# Patient Record
Sex: Female | Born: 1940
Health system: Southern US, Community
[De-identification: ages and names within clinical notes are randomized; demographics above are authoritative.]

## PROBLEM LIST (undated history)

## (undated) DIAGNOSIS — R569 Unspecified convulsions: Secondary | ICD-10-CM

## (undated) DIAGNOSIS — E079 Disorder of thyroid, unspecified: Secondary | ICD-10-CM

## (undated) DIAGNOSIS — I10 Essential (primary) hypertension: Secondary | ICD-10-CM

## (undated) DIAGNOSIS — I639 Cerebral infarction, unspecified: Secondary | ICD-10-CM

## (undated) DIAGNOSIS — M810 Age-related osteoporosis without current pathological fracture: Secondary | ICD-10-CM

## (undated) DIAGNOSIS — E785 Hyperlipidemia, unspecified: Secondary | ICD-10-CM

## (undated) HISTORY — DX: Essential (primary) hypertension: I10

## (undated) HISTORY — DX: Disorder of thyroid, unspecified: E07.9

## (undated) HISTORY — DX: Unspecified convulsions: R56.9

## (undated) HISTORY — PX: EYE SURGERY: SHX253

## (undated) HISTORY — PX: FRACTURE SURGERY: SHX138

## (undated) HISTORY — DX: Cerebral infarction, unspecified: I63.9

## (undated) HISTORY — PX: ELBOW FRACTURE SURGERY: SHX616

## (undated) HISTORY — DX: Age-related osteoporosis without current pathological fracture: M81.0

## (undated) HISTORY — PX: SPINE SURGERY: SHX786

## (undated) HISTORY — DX: Hyperlipidemia, unspecified: E78.5

---

## 2016-09-23 ENCOUNTER — Other Ambulatory Visit: Payer: Self-pay | Admitting: General Surgery

## 2016-09-23 DIAGNOSIS — R921 Mammographic calcification found on diagnostic imaging of breast: Secondary | ICD-10-CM

## 2016-09-30 ENCOUNTER — Ambulatory Visit
Admission: RE | Admit: 2016-09-30 | Discharge: 2016-09-30 | Disposition: A | Discharge status: Home / Self Care | Payer: Medicare Other | Source: Ambulatory Visit | Attending: General Surgery | Admitting: General Surgery

## 2016-09-30 ENCOUNTER — Other Ambulatory Visit: Payer: Self-pay | Admitting: General Surgery

## 2016-09-30 DIAGNOSIS — R921 Mammographic calcification found on diagnostic imaging of breast: Secondary | ICD-10-CM

## 2016-10-03 ENCOUNTER — Ambulatory Visit
Admission: RE | Admit: 2016-10-03 | Discharge: 2016-10-03 | Disposition: A | Discharge status: Home / Self Care | Payer: Medicare Other | Source: Ambulatory Visit | Attending: General Surgery | Admitting: General Surgery

## 2016-10-03 DIAGNOSIS — R921 Mammographic calcification found on diagnostic imaging of breast: Secondary | ICD-10-CM

## 2017-01-29 ENCOUNTER — Telehealth: Payer: Self-pay | Admitting: Emergency Medicine

## 2017-01-29 ENCOUNTER — Ambulatory Visit (INDEPENDENT_AMBULATORY_CARE_PROVIDER_SITE_OTHER): Payer: Medicare Other | Admitting: Emergency Medicine

## 2017-01-29 VITALS — BP 128/78 | HR 77 | Temp 98.1°F | Resp 14 | Wt 147.0 lb

## 2017-01-29 DIAGNOSIS — R0989 Other specified symptoms and signs involving the circulatory and respiratory systems: Secondary | ICD-10-CM | POA: Diagnosis not present

## 2017-01-29 DIAGNOSIS — E079 Disorder of thyroid, unspecified: Secondary | ICD-10-CM | POA: Diagnosis not present

## 2017-01-29 DIAGNOSIS — I10 Essential (primary) hypertension: Secondary | ICD-10-CM | POA: Insufficient documentation

## 2017-01-29 DIAGNOSIS — M810 Age-related osteoporosis without current pathological fracture: Secondary | ICD-10-CM | POA: Diagnosis not present

## 2017-01-29 MED ORDER — LISINOPRIL-HYDROCHLOROTHIAZIDE 20-25 MG PO TABS: 1.0000 | ORAL_TABLET | Freq: Every day | ORAL | 3 refills | Status: DC

## 2017-01-29 MED ORDER — LEVOTHYROXINE SODIUM 75 MCG PO TABS: 75.0000 ug | ORAL_TABLET | Freq: Every day | ORAL | 3 refills | Status: DC

## 2017-01-29 MED ORDER — ALENDRONATE SODIUM 70 MG PO TABS: 70.0000 mg | ORAL_TABLET | ORAL | 6 refills | Status: AC

## 2017-01-29 NOTE — Telephone Encounter (Signed)
Pts husband called & want Dr. Irving ShowsMiguel to see if he can call the ins about meds being sent to home. He said its a service they offered him.   Please Advise

## 2017-01-29 NOTE — Patient Instructions (Signed)
     IF you received an x-ray today, you will receive an invoice from [facility]. Please contact [facility] at [phone] with questions or concerns regarding your invoice.   IF you received labwork today, you will receive an invoice from [facility]. Please contact [facility] at [phone] with questions or concerns regarding your invoice.   Our billing staff will not be able to assist you with questions regarding bills from these companies.  You will be contacted with the lab results as soon as they are available. The fastest way to get your results is to activate your My Chart account. Instructions are located on the last page of this paperwork. If you have not heard from us regarding the results in 2 weeks, please contact this office.     

## 2017-01-29 NOTE — Progress Notes (Signed)
Patient ID: Carrie Floyd, female   DOB: 05/23/1941, 76 y.o.   MRN: [id number] Carrie Floyd 76 y.o.   Chief Complaint  Patient presents with  . Medication Refill    all. Pt unsure which [facility] to use    HISTORY OF PRESENT ILLNESS: This is a 76 y.o. female has no complaints. Wants to establish care; has h/o HTN, thyroid condition, and Osteoporosis.  HPI   Prior to Admission medications   Medication Sig Start Date End Date Taking? Authorizing Provider  alendronate (FOSAMAX) 70 MG tablet Take 70 mg by mouth once a week. Take with a full glass of water on an empty stomach.   Yes Historical Provider, MD  aspirin EC 81 MG tablet Take 81 mg by mouth daily.   Yes Historical Provider, MD  levothyroxine (SYNTHROID, LEVOTHROID) 75 MCG tablet Take 75 mcg by mouth daily before breakfast.   Yes Historical Provider, MD  lisinopril-hydrochlorothiazide (PRINZIDE,ZESTORETIC) 20-25 MG tablet Take 1 tablet by mouth daily.   Yes Historical Provider, MD  POTASSIUM PO Take 99 mg by mouth.   Yes Historical Provider, MD    Not on File  There are no active problems to display for this patient.   Past Medical History:  Diagnosis Date  . Hypertension   . Thyroid disease     Past Surgical History:  Procedure Laterality Date  . EYE SURGERY    . FRACTURE SURGERY    . SPINE SURGERY      Social History   Social History  . Marital status: Married    Spouse name: N/A  . Number of children: N/A  . Years of education: N/A   Occupational History  . Not on file.   Social History Main Topics  . Smoking status: Never Smoker  . Smokeless tobacco: Never Used  . Alcohol use Not on file  . Drug use: Unknown  . Sexual activity: Not on file   Other Topics Concern  . Not on file   Social History Narrative  . No narrative on file    Family History  Problem Relation Age of Onset  . Hyperlipidemia Sister   . Hypertension Sister      Review of Systems  Constitutional: Negative for chills,  fever, malaise/fatigue and weight loss.  HENT: Negative for congestion, hearing loss, nosebleeds, sinus pain, sore throat and tinnitus.   Eyes: Negative for blurred vision, double vision and pain.  Respiratory: Negative for cough, hemoptysis, shortness of breath and wheezing.   Cardiovascular: Negative for chest pain, palpitations, orthopnea, claudication, leg swelling and PND.  Gastrointestinal: Negative for abdominal pain, blood in stool, diarrhea, melena, nausea and vomiting.  Genitourinary: Negative for dysuria, flank pain and hematuria.  Musculoskeletal: Negative for back pain, joint pain, myalgias and neck pain.       Chronic leg cramps.  Skin: Negative for rash.  Neurological: Negative for dizziness, sensory change, speech change, focal weakness and headaches.       Has h/o CVA no deficits  Endo/Heme/Allergies: Negative.   All other systems reviewed and are negative.  Vitals:   01/29/17 0852  BP: 128/78  Pulse: 77  Resp: 14  Temp: 98.1 F (36.7 C)     Physical Exam  Constitutional: She is oriented to person, place, and time. She appears well-developed and well-nourished.  HENT:  Head: Normocephalic.  Right Ear: External ear normal.  Left Ear: External ear normal.  Nose: Nose normal.  Mouth/Throat: Oropharynx is clear and moist. No oropharyngeal exudate.  Eyes: Conjunctivae  and EOM are normal. Pupils are equal, round, and reactive to light.  Neck: Normal range of motion. Neck supple. No JVD present. Carotid bruit is present (L>R). No thyromegaly present.  Cardiovascular: Normal rate, regular rhythm, normal heart sounds and intact distal pulses.   No murmur heard. No leg edema.  Pulmonary/Chest: Effort normal and breath sounds normal. She has no wheezes. She has no rales.  Abdominal: Soft. Bowel sounds are normal. She exhibits no distension. There is no tenderness.  Musculoskeletal: Normal range of motion.  Lymphadenopathy:    She has no cervical adenopathy.   Neurological: She is alert and oriented to person, place, and time. No sensory deficit. She exhibits normal muscle tone. Coordination normal.  Skin: Skin is warm and dry. Capillary refill takes less than 2 seconds. No rash noted.  Psychiatric: She has a normal mood and affect. Her behavior is normal.  Vitals reviewed.    ASSESSMENT & PLAN: Carrie Floyd was seen today for medication refill.  Diagnoses and all orders for this visit:  Essential hypertension -     Comprehensive metabolic panel -     CBC with Differential/Platelet -     Lipid panel  Osteoporosis, unspecified osteoporosis type, unspecified pathological fracture presence  Thyroid condition -     TSH  Bilateral carotid bruits -     US Carotid Duplex Bilateral; Future      Edwina Barth, MD [facility]

## 2017-01-29 NOTE — Progress Notes (Signed)
Left message on home vm to [facility] with requested pharmacy.  [employer] interpretor used [id number]

## 2017-01-30 LAB — CBC WITH DIFFERENTIAL/PLATELET
Basophils Absolute: 0 10*3/uL (ref 0.0–0.2)
Basos: 0 %
EOS (ABSOLUTE): 0.2 10*3/uL (ref 0.0–0.4)
EOS: 2 %
HEMATOCRIT: 39.3 % (ref 34.0–46.6)
Hemoglobin: 12.6 g/dL (ref 11.1–15.9)
IMMATURE GRANULOCYTES: 1 %
Immature Grans (Abs): 0.1 10*3/uL (ref 0.0–0.1)
Lymphocytes Absolute: 2.4 10*3/uL (ref 0.7–3.1)
Lymphs: 27 %
MCH: 28.8 pg (ref 26.6–33.0)
MCHC: 32.1 g/dL (ref 31.5–35.7)
MCV: 90 fL (ref 79–97)
MONOS ABS: 0.6 10*3/uL (ref 0.1–0.9)
Monocytes: 7 %
NEUTROS PCT: 63 %
Neutrophils Absolute: 5.4 10*3/uL (ref 1.4–7.0)
PLATELETS: 238 10*3/uL (ref 150–379)
RBC: 4.38 x10E6/uL (ref 3.77–5.28)
RDW: 13.8 % (ref 12.3–15.4)
WBC: 8.7 10*3/uL (ref 3.4–10.8)

## 2017-01-30 LAB — COMPREHENSIVE METABOLIC PANEL
ALK PHOS: 55 IU/L (ref 39–117)
ALT: 13 IU/L (ref 0–32)
AST: 21 IU/L (ref 0–40)
Albumin/Globulin Ratio: 1.4 (ref 1.2–2.2)
Albumin: 4.2 g/dL (ref 3.5–4.8)
BUN/Creatinine Ratio: 22 (ref 12–28)
BUN: 15 mg/dL (ref 8–27)
Bilirubin Total: <0.2 mg/dL (ref 0.0–1.2)
CO2: 28 mmol/L (ref 18–29)
Calcium: 9.6 mg/dL (ref 8.7–10.3)
Chloride: 98 mmol/L (ref 96–106)
Creatinine, Ser: 0.67 mg/dL (ref 0.57–1.00)
GFR calc Af Amer: 100 mL/min/{1.73_m2} (ref >59)
GFR calc non Af Amer: 87 mL/min/{1.73_m2} (ref >59)
GLOBULIN, TOTAL: 3.1 g/dL (ref 1.5–4.5)
Glucose: 91 mg/dL (ref 65–99)
Potassium: 3.9 mmol/L (ref 3.5–5.2)
Sodium: 142 mmol/L (ref 134–144)
Total Protein: 7.3 g/dL (ref 6.0–8.5)

## 2017-01-30 LAB — LIPID PANEL
CHOLESTEROL TOTAL: 247 mg/dL — AB (ref 100–199)
Chol/HDL Ratio: 5 ratio units — ABNORMAL HIGH (ref 0.0–4.4)
HDL: 49 mg/dL (ref >39)
LDL Calculated: 171 mg/dL — ABNORMAL HIGH (ref 0–99)
TRIGLYCERIDES: 133 mg/dL (ref 0–149)
VLDL Cholesterol Cal: 27 mg/dL (ref 5–40)

## 2017-01-30 LAB — TSH: TSH: 1.4 u[IU]/mL (ref 0.450–4.500)

## 2017-01-31 NOTE — Telephone Encounter (Signed)
Spoke with patient's husband. He doesn't have time to discuss this right now, and stated he would call back later. He was not able to give me the name of the mail order pharmacy they'd like to use.

## 2017-03-05 ENCOUNTER — Ambulatory Visit (INDEPENDENT_AMBULATORY_CARE_PROVIDER_SITE_OTHER): Payer: Medicare Other | Admitting: Emergency Medicine

## 2017-03-05 ENCOUNTER — Ambulatory Visit (INDEPENDENT_AMBULATORY_CARE_PROVIDER_SITE_OTHER): Payer: Medicare Other

## 2017-03-05 ENCOUNTER — Encounter: Payer: Self-pay | Admitting: Emergency Medicine

## 2017-03-05 VITALS — BP 132/60 | HR 93 | Temp 98.5°F | Resp 16 | Ht 61.5 in | Wt 143.0 lb

## 2017-03-05 DIAGNOSIS — R1013 Epigastric pain: Secondary | ICD-10-CM | POA: Insufficient documentation

## 2017-03-05 DIAGNOSIS — I10 Essential (primary) hypertension: Secondary | ICD-10-CM | POA: Diagnosis not present

## 2017-03-05 DIAGNOSIS — R0989 Other specified symptoms and signs involving the circulatory and respiratory systems: Secondary | ICD-10-CM | POA: Diagnosis not present

## 2017-03-05 DIAGNOSIS — S29011A Strain of muscle and tendon of front wall of thorax, initial encounter: Secondary | ICD-10-CM

## 2017-03-05 MED ORDER — TRAMADOL HCL 50 MG PO TABS: 50.0000 mg | ORAL_TABLET | Freq: Three times a day (TID) | ORAL | 0 refills | Status: DC | PRN

## 2017-03-05 MED ORDER — KETOROLAC TROMETHAMINE 60 MG/2ML IM SOLN
60.0000 mg | Freq: Once | INTRAMUSCULAR | Status: AC
  Administered 2017-03-05: 60 mg via INTRAMUSCULAR

## 2017-03-05 NOTE — Progress Notes (Signed)
Carrie Floyd 76 y.o.   Chief Complaint  Patient presents with  . Flank Pain    NEAR RIGHT BREAST x 2 days    HISTORY OF PRESENT ILLNESS: This is a 76 y.o. female complaining of pain to right lateral ribcage area x 2 days; thinks she strained the area prior to symptoms.  HPI   Prior to Admission medications   Medication Sig Start Date End Date Taking? Authorizing Provider  aspirin EC 81 MG tablet Take 81 mg by mouth daily.   Yes Historical Provider, MD  levothyroxine (SYNTHROID, LEVOTHROID) 75 MCG tablet Take 1 tablet (75 mcg total) by mouth daily before breakfast. 01/29/17 04/29/17 Yes Devontre Siedschlag Victorino December, MD  lisinopril-hydrochlorothiazide (PRINZIDE,ZESTORETIC) 20-25 MG tablet Take 1 tablet by mouth daily. 01/29/17 04/29/17 Yes Tyjai Charbonnet Victorino December, MD  POTASSIUM PO Take 99 mg by mouth.   Yes Historical Provider, MD    No Known Allergies  Patient Active Problem List   Diagnosis Date Noted  . Essential hypertension 01/29/2017  . Thyroid condition 01/29/2017  . Bilateral carotid bruits 01/29/2017  . Osteoporosis 01/29/2017    Past Medical History:  Diagnosis Date  . Hypertension   . Osteoporosis   . Stroke (HCC)   . Thyroid disease     Past Surgical History:  Procedure Laterality Date  . EYE SURGERY    . FRACTURE SURGERY    . SPINE SURGERY      Social History   Social History  . Marital status: Married    Spouse name: N/A  . Number of children: N/A  . Years of education: N/A   Occupational History  . Not on file.   Social History Main Topics  . Smoking status: Never Smoker  . Smokeless tobacco: Never Used  . Alcohol use Not on file  . Drug use: Unknown  . Sexual activity: Not on file   Other Topics Concern  . Not on file   Social History Narrative  . No narrative on file    Family History  Problem Relation Age of Onset  . Hyperlipidemia Sister   . Hypertension Sister      Review of Systems  Constitutional: Negative for chills, fever and  weight loss.       Appetite change. Distaste for fried/greasy foods.  HENT: Negative.   Eyes: Negative.   Respiratory: Negative for cough and shortness of breath.   Cardiovascular: Negative.  Negative for chest pain, palpitations and leg swelling.  Gastrointestinal: Positive for abdominal pain (epigastric). Negative for blood in stool, diarrhea, melena, nausea and vomiting.  Genitourinary: Negative for dysuria and hematuria.  Musculoskeletal: Negative for back pain, joint pain, myalgias and neck pain.       Chronic leg cramps  Skin: Negative.  Negative for rash.  Neurological: Negative for dizziness, sensory change, focal weakness and headaches.  Endo/Heme/Allergies: Negative.   All other systems reviewed and are negative.  Vitals:   03/05/17 0956 03/05/17 1019  BP: (!) 142/72 132/60  Pulse: 93   Resp: 16   Temp: 98.5 F (36.9 C)      Physical Exam  Constitutional: She is oriented to person, place, and time. She appears well-developed and well-nourished.  HENT:  Head: Normocephalic and atraumatic.  Mouth/Throat: Oropharynx is clear and moist. No oropharyngeal exudate.  Eyes: Conjunctivae and EOM are normal. Pupils are equal, round, and reactive to light.  Neck: Normal range of motion. Neck supple. No JVD present. No thyromegaly present.  Cardiovascular: Normal rate, regular rhythm, normal heart  sounds and intact distal pulses.   Pulmonary/Chest: Effort normal and breath sounds normal. She has no wheezes. She has no rales. She exhibits tenderness (right mid-lateral, pain reproduced with arm movement). She exhibits no crepitus. Right breast exhibits no mass, no nipple discharge, no skin change and no tenderness.  Abdominal: Soft. Bowel sounds are normal. She exhibits no distension. There is no tenderness.  Musculoskeletal: Normal range of motion.  Lymphadenopathy:    She has no cervical adenopathy.  Neurological: She is alert and oriented to person, place, and time.  Skin: Skin  is warm and dry. Capillary refill takes less than 2 seconds. No rash noted.  Psychiatric: She has a normal mood and affect. Her behavior is normal.  Vitals reviewed.    ASSESSMENT & PLAN: Carrie Floyd was seen today for flank pain.  Diagnoses and all orders for this visit:  Muscle strain of chest wall, initial encounter -     ketorolac (TORADOL) injection 60 mg; Inject 2 mLs (60 mg total) into the muscle once. -     DG Chest 2 View; Future  Essential hypertension  Epigastric discomfort -     Ambulatory referral to Gastroenterology  Other orders -     traMADol (ULTRAM) 50 MG tablet; Take 1 tablet (50 mg total) by mouth every 8 (eight) hours as needed.    Patient Instructions       IF you received an x-ray today, you will receive an invoice from [facility]. Please contact [facility] at [phone] with questions or concerns regarding your invoice.   IF you received labwork today, you will receive an invoice from [facility]. Please contact [facility] at [phone] with questions or concerns regarding your invoice.   Our billing staff will not be able to assist you with questions regarding bills from these companies.  You will be contacted with the lab results as soon as they are available. The fastest way to get your results is to activate your My Chart account. Instructions are located on the last page of this paperwork. If you have not heard from [place] regarding the results in 2 weeks, please contact this office.    We recommend that you schedule a mammogram for breast cancer screening. Typically, you do not need a referral to do this. Please contact a local imaging center to schedule your mammogram.  [facility] - [phone]  *ask for the Radiology Department [facility]) - [phone] or [phone]  [facility] - [phone] [facility] - [phone] [facility] - [phone]   *ask for the Radiology Department [facility] - [phone]  *ask for the Radiology Department [facility] - [phone]  *ask for the Mammography Department [facility] - [phone] Dolor en la pared torcica (Chest Wall Pain) El dolor en la pared torcica se produce en los huesos y los msculos del pecho o alrededor de Scientist, product/process development. A veces, una lesin Occupational psychologist. En ocasiones, la causa puede ser desconocida. Este dolor puede durar varias semanas. CUIDADOS EN EL HOGAR Est atento a cualquier cambio en los sntomas. Tome estas medidas para Acupuncturist dolor:  Sports administrator reposo tal como le indic el mdico.  Evite las actividades que causan dolor. Intente no usar los [address] del trax, los del vientre (abdominales) o los laterales para levantar objetos pesados.  Si se lo indican, aplique hielo sobre la zona dolorida:  Ponga el hielo en una bolsa plstica.  Coloque una toalla entre la piel y la bolsa de hielo.  Coloque el hielo durante , 2 a 3veces por Futures trader.  Tome los medicamentos de venta libre y los recetados solamente como se lo haya indicado el mdico.  No consuma productos que contengan tabaco, incluidos cigarrillos, tabaco de Theatre manager y Administrator, Civil Service. Si necesita ayuda para dejar de fumar, consulte al mdico.  Concurra a todas las visitas de control como se lo haya indicado el mdico. Esto es importante. SOLICITE AYUDA SI:  Tiene fiebre.  El dolor en el pecho [place].  Aparecen nuevos sntomas. SOLICITE AYUDA DE INMEDIATO SI:  Tiene malestar estomacal (nuseas) o vomita.  Berenice Primas o tiene sensacin de desvanecimiento.  Tiene tos con flema (esputo) o expectora sangre al toser.  Comienza a sentir falta de aire. Esta informacin no tiene Theme park manager el consejo del mdico. Asegrese de hacerle al mdico cualquier pregunta que tenga. Document Released: 10/10/2011 Document Revised: 07/12/2015 Document  Reviewed: 01/16/2015 Elsevier Interactive Patient Education  2017 Elsevier Inc.      Edwina Barth, MD [facility]

## 2017-03-05 NOTE — Addendum Note (Signed)
Addended by: Evie Lacks on: 03/05/2017 12:26 PM   Modules accepted: Orders

## 2017-03-05 NOTE — Patient Instructions (Addendum)
     IF you received an x-ray today, you will receive an invoice from [facility]. Please contact [facility] at [phone] with questions or concerns regarding your invoice.   IF you received labwork today, you will receive an invoice from [facility]. Please contact [facility] at [phone] with questions or concerns regarding your invoice.   Our billing staff will not be able to assist you with questions regarding bills from these companies.  You will be contacted with the lab results as soon as they are available. The fastest way to get your results is to activate your My Chart account. Instructions are located on the last page of this paperwork. If you have not heard from [place] regarding the results in 2 weeks, please contact this office.    We recommend that you schedule a mammogram for breast cancer screening. Typically, you do not need a referral to do this. Please contact a local imaging center to schedule your mammogram.  [facility] - [phone]  *ask for the Radiology Department [facility]) - [phone] or [phone]  [facility] - [phone] [facility] - [phone] [facility] - [phone]  *ask for the Radiology Department [facility] - [phone]  *ask for the Radiology Department [facility] - [phone]  *ask for the Mammography Department [facility] - [phone] Dolor en la pared torcica (Chest Wall Pain) El dolor en la pared torcica se produce en los huesos y los msculos del pecho o alrededor de Scientist, product/process development. A veces, una lesin Occupational psychologist. En ocasiones, la causa puede ser desconocida. Este dolor puede durar varias semanas. CUIDADOS EN EL HOGAR Est atento a cualquier cambio en los sntomas. Tome estas medidas para Acupuncturist dolor:  Sports administrator reposo tal como le indic el mdico.  Evite las actividades que causan dolor.  Intente no usar los [address] del trax, los del vientre (abdominales) o los laterales para levantar objetos pesados.  Si se lo indican, aplique hielo sobre la zona dolorida:  Ponga el hielo en una bolsa plstica.  Coloque una toalla entre la piel y la bolsa de hielo.  Coloque el hielo durante , 2 a 3veces por Futures trader.  Tome los medicamentos de venta libre y los recetados solamente como se lo haya indicado el mdico.  No consuma productos que contengan tabaco, incluidos cigarrillos, tabaco de Theatre manager y Administrator, Civil Service. Si necesita ayuda para dejar de fumar, consulte al mdico.  Concurra a todas las visitas de control como se lo haya indicado el mdico. Esto es importante. SOLICITE AYUDA SI:  Tiene fiebre.  El dolor en el pecho [place].  Aparecen nuevos sntomas. SOLICITE AYUDA DE INMEDIATO SI:  Tiene malestar estomacal (nuseas) o vomita.  Berenice Primas o tiene sensacin de desvanecimiento.  Tiene tos con flema (esputo) o expectora sangre al toser.  Comienza a sentir falta de aire. Esta informacin no tiene Theme park manager el consejo del mdico. Asegrese de hacerle al mdico cualquier pregunta que tenga. Document Released: 10/10/2011 Document Revised: 07/12/2015 Document Reviewed: 01/16/2015 Elsevier Interactive Patient Education  2017 [employer].

## 2017-03-06 ENCOUNTER — Telehealth: Payer: Self-pay

## 2017-03-06 DIAGNOSIS — R0989 Other specified symptoms and signs involving the circulatory and respiratory systems: Secondary | ICD-10-CM

## 2017-03-06 NOTE — Telephone Encounter (Signed)
Imaging order needs to be changed to [id number] [id number] per Radiology  Please sign as appropriate

## 2017-03-06 NOTE — Telephone Encounter (Signed)
Done

## 2017-03-10 ENCOUNTER — Encounter: Payer: Self-pay | Admitting: Internal Medicine

## 2017-03-12 ENCOUNTER — Ambulatory Visit (HOSPITAL_COMMUNITY): Payer: Medicare Other

## 2017-04-11 ENCOUNTER — Encounter: Payer: Self-pay | Admitting: Internal Medicine

## 2017-04-11 ENCOUNTER — Encounter (INDEPENDENT_AMBULATORY_CARE_PROVIDER_SITE_OTHER): Payer: Self-pay

## 2017-04-11 ENCOUNTER — Ambulatory Visit (INDEPENDENT_AMBULATORY_CARE_PROVIDER_SITE_OTHER): Payer: Medicare Other | Admitting: Internal Medicine

## 2017-04-11 VITALS — BP 104/60 | HR 70 | Wt 143.0 lb

## 2017-04-11 DIAGNOSIS — K253 Acute gastric ulcer without hemorrhage or perforation: Secondary | ICD-10-CM | POA: Diagnosis not present

## 2017-04-11 DIAGNOSIS — R1013 Epigastric pain: Secondary | ICD-10-CM

## 2017-04-11 DIAGNOSIS — R634 Abnormal weight loss: Secondary | ICD-10-CM

## 2017-04-11 MED ORDER — OMEPRAZOLE 40 MG PO CPDR: 40.0000 mg | DELAYED_RELEASE_CAPSULE | Freq: Every day | ORAL | 6 refills | Status: DC

## 2017-04-11 MED FILL — OMEPRAZOLE DR 40 MG CAPSULE: 40 | 60 days supply | Qty: 60 | Fill #0

## 2017-04-11 NOTE — Patient Instructions (Signed)
We have sent the following medications to your pharmacy for you to pick up at your convenience:  Omeprazole  You have been scheduled for an endoscopy and flexible sigmoidoscopy. Please follow the written instructions given to you at your visit today. If you use inhalers (even only as needed), please bring them with you on the day of your procedure. Your physician has requested that you go to www.startemmi.com and enter the access code given to you at your visit today. This web site gives a general overview about your procedure. However, you should still follow specific instructions given to you by our office regarding your preparation for the procedure.

## 2017-04-11 NOTE — Progress Notes (Signed)
HISTORY OF PRESENT ILLNESS:  Carrie Floyd is a 76 y.o. female , native of [place], who is referred today by the internal medicine clinic Dr. Terence LuxSargardia with a chief complaint of epigastric pain. She is accompanied by her husband and a professional interpreter. Outside records from [place] and local outside records personally reviewed. The patient reports a one-year history of epigastric and lower chest discomfort. She underwent workup while living in [place]. She does bring with her the colonoscopy and upper endoscopy reports. Colonoscopy was negative except for diminutive polyps (no pathology available). 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 According to the patient she was treated normal biopsies performed. Her PCP told her to take Pepcid when her stomach bothers her. She does report that the discomfort is exacerbated by meals. She is lost 10-15 pounds over the past year. Nausea with rare vomiting. No hematemesis. No melena. She does take aspirin no additional NSAIDs. Recently took tramadol for right side pain which has since resolved. Review of blood work from March 2018 shows unremarkable comprehensive metabolic panel and CBC with hemoglobin 12.6. GI review of systems is otherwise negative. No history of abdominal surgeries.  REVIEW OF SYSTEMS:  All non-GI ROS negative upon comprehensive review  Past Medical History:  Diagnosis Date  . Hypertension   . Osteoporosis   . Stroke (HCC)   . Thyroid disease     Past Surgical History:  Procedure Laterality Date  . EYE SURGERY    . FRACTURE SURGERY    . SPINE SURGERY      Social History Carrie Mattedelaida [place]  reports that she has never smoked. She has never used smokeless tobacco. She reports that she does not drink alcohol or use drugs.  family history includes Hyperlipidemia in her sister; Hypertension in her sister.  No Known Allergies     PHYSICAL EXAMINATION: Vital signs: BP 104/60   Pulse 70   Wt 143 lb (64.9 kg)    BMI 26.58 kg/m   Constitutional: generally well-appearing, no acute distress Psychiatric: alert and oriented x3, cooperative Eyes: extraocular movements intact, anicteric, conjunctiva pink Mouth: oral pharynx moist, no lesions Neck: supple no lymphadenopathy Cardiovascular: heart regular rate and rhythm, no murmur Lungs: clear to auscultation bilaterally Abdomen: soft, Mild epigastric tenderness with palpation, no mass, nondistended, no obvious ascites, no peritoneal signs, normal bowel sounds, no organomegaly Rectal:Omitted Extremities: no clubbing cyanosis or lower extremity edema bilaterally Skin: no lesions on visible extremities Neuro: No focal deficits. Cranial nerves intact  ASSESSMENT:  #1. Epigastric pain. Chronic. Suspect secondary to gastric ulcer #2. Weight loss. Suspect secondary to #1 above   PLAN:  #1. Prescribe omeprazole 40 mg twice daily #2. Avoid NSAIDs. Okay to take daily baby aspirin for cardioprotective or cerebrovascular protective purposes. She takes this infrequently am told #3. Schedule upper endoscopy with biopsies.The nature of the procedure, as well as the risks, benefits, and alternatives were carefully and thoroughly reviewed with the patient. Ample time for discussion and questions allowed. The patient understood, was satisfied, and agreed to proceed. #4. If pain persists despite PPI and no evidence of ulcer then recommend imaging studies including ultrasound to rule out gallstones and/or CT scan to rule out pancreatic cancer. I discussed this with them  A copy of this consultation has been sent to Dr. Terence LuxSargardia

## 2017-05-09 ENCOUNTER — Encounter: Payer: Self-pay | Admitting: Internal Medicine

## 2017-05-21 ENCOUNTER — Encounter: Payer: Self-pay | Admitting: Internal Medicine

## 2017-05-21 ENCOUNTER — Ambulatory Visit (AMBULATORY_SURGERY_CENTER): Payer: Medicare Other | Admitting: Internal Medicine

## 2017-05-21 VITALS — BP 105/54 | HR 65 | Temp 98.4°F | Resp 21 | Ht 61.5 in | Wt 143.0 lb

## 2017-05-21 DIAGNOSIS — R1013 Epigastric pain: Secondary | ICD-10-CM | POA: Diagnosis present

## 2017-05-21 DIAGNOSIS — K253 Acute gastric ulcer without hemorrhage or perforation: Secondary | ICD-10-CM

## 2017-05-21 MED ORDER — SODIUM CHLORIDE 0.9 % IV SOLN: 500.0000 mL | INTRAVENOUS | Status: DC

## 2017-05-21 NOTE — Progress Notes (Signed)
No problems noted in the recovery room. maw 

## 2017-05-21 NOTE — Progress Notes (Signed)
Called to room to assist during endoscopic procedure.  Patient ID and intended procedure confirmed with present staff. Received instructions for my participation in the procedure from the performing physician.  

## 2017-05-21 NOTE — Op Note (Signed)
[facility] Patient Name: Carrie Floyd Procedure Date: 05/21/2017 10:12 AM MRN: [id number] Endoscopist: Wilhemina Bonito. Marina Goodell , MD Age: 76 Referring MD:  Date of Birth: 1941-01-22 Gender: Female Account #: [id number] Procedure:                Upper GI endoscopy with biopsies Indications:              Epigastric abdominal pain. Personal history of                            gastric ulcer elsewhere. Placed on PPI with                            improvement in symptoms. Now taking PPI sporadically Medicines:                Monitored Anesthesia Care Procedure:                Pre-Anesthesia Assessment:                           - Prior to the procedure, a History and Physical                            was performed, and patient medications and                            allergies were reviewed. The patient's tolerance of                            previous anesthesia was also reviewed. The risks                            and benefits of the procedure and the sedation                            options and risks were discussed with the patient.                            All questions were answered, and informed consent                            was obtained. Prior Anticoagulants: The patient has                            taken no previous anticoagulant or antiplatelet                            agents. ASA Grade Assessment: II - A patient with                            mild systemic disease. After reviewing the risks                            and benefits, the patient was deemed in  satisfactory condition to undergo the procedure.                           After obtaining informed consent, the endoscope was                            passed under direct vision. Throughout the                            procedure, the patient's blood pressure, pulse, and                            oxygen saturations were monitored continuously. The    Endoscope was introduced through the mouth, and                            advanced to the second part of duodenum. The upper                            GI endoscopy was accomplished without difficulty.                            The patient tolerated the procedure well. Scope In: Scope Out: Findings:                 The esophagus was normal.                           One superficial gastric ulcer was found at the                            incisura. Biopsies were taken with a cold forceps                            for Helicobacter pylori testing using CLOtest.                           The stomach revealed diffusely atrophic gastric                            mucosa but was normal.                           The examined duodenum was normal.                           The cardia and gastric fundus were normal on                            retroflexion. Complications:            No immediate complications. Estimated Blood Loss:     Estimated blood loss: none. Impression:               - Normal esophagus.                           -  Gastric ulcer. Biopsied.                           - Normal stomach.                           - Normal examined duodenum. Recommendation:           1. Resume omeprazole 20 mg once daily                           2. Follow-up testing for Helicobacter pylori and                            treat if positive                           3. Avoid unnecessary NSAIDs. Baby aspirin okay if                            important for cardiovascular or cerebrovascular                            protective purposes                           4. Office follow-up with Dr. Marina GoodellPerry in about 8 weeks. Wilhemina BonitoJohn N. Marina GoodellPerry, MD 05/21/2017 10:29:25 AM This report has been signed electronically.

## 2017-05-21 NOTE — Progress Notes (Signed)
Spontaneous respirations throughout. VSS. Resting comfortably. To PACU on room air. Report to  Annette RN.  

## 2017-05-21 NOTE — Progress Notes (Signed)
Pt speaks spanish and has an interpreter, [employer] Mor[employer].  Maw

## 2017-05-21 NOTE — Patient Instructions (Signed)
YOU HAD AN ENDOSCOPIC PROCEDURE TODAY AT THE [facility]:   Refer to the procedure report that was given to you for any specific questions about what was found during the examination.  If the procedure report does not answer your questions, please call your gastroenterologist to clarify.  If you requested that your care partner not be given the details of your procedure findings, then the procedure report has been included in a sealed envelope for you to review at your convenience later.  YOU SHOULD EXPECT: Some feelings of bloating in the abdomen. Passage of more gas than usual.  Walking can help get rid of the air that was put into your GI tract during the procedure and reduce the bloating. If you had a lower endoscopy (such as a colonoscopy or flexible sigmoidoscopy) you may notice spotting of blood in your stool or on the toilet paper. If you underwent a bowel prep for your procedure, you may not have a normal bowel movement for a few days.  Please Note:  You might notice some irritation and congestion in your nose or some drainage.  This is from the oxygen used during your procedure.  There is no need for concern and it should clear up in a day or so.  SYMPTOMS TO REPORT IMMEDIATELY:    Following upper endoscopy (EGD)  Vomiting of blood or coffee ground material  New chest pain or pain under the shoulder blades  Painful or persistently difficult swallowing  New shortness of breath  Fever of 100F or higher  Black, tarry-looking stools  For urgent or emergent issues, a gastroenterologist can be reached at any hour by calling [phone].   DIET:  We do recommend a small meal at first, but then you may proceed to your regular diet.  Drink plenty of fluids but you should avoid alcoholic beverages for 24 hours.  ACTIVITY:  You should plan to take it easy for the rest of today and you should NOT DRIVE or use heavy machinery until tomorrow (because of the sedation medicines used  during the test).    FOLLOW UP: Our staff will call the number listed on your records the next business day following your procedure to check on you and address any questions or concerns that you may have regarding the information given to you following your procedure. If we do not reach you, we will leave a message.  However, if you are feeling well and you are not experiencing any problems, there is no need to return our call.  We will assume that you have returned to your regular daily activities without incident.  If any biopsies were taken you will be contacted by phone or by letter within the next 1-3 weeks.  Please call us at [phone] if you have not heard about the biopsies in 3 weeks.    SIGNATURES/CONFIDENTIALITY: You and/or your care partner have signed paperwork which will be entered into your electronic medical record.  These signatures attest to the fact that that the information above on your After Visit Summary has been reviewed and is understood.  Full responsibility of the confidentiality of this discharge information lies with you and/or your care-partner.    Resume OMEPRAZOLE 20 mg to once daily. Avoid NSAIDs.  Baby aspirin okay if important for cardiovascular or cerebrovascular protective purposes. You may resume your other current medications today. Await biopsy results. Please call if any questions or concerns.

## 2017-05-22 ENCOUNTER — Telehealth: Payer: Self-pay

## 2017-05-22 LAB — HELICOBACTER PYLORI SCREEN-BIOPSY: UREASE: NEGATIVE

## 2017-05-22 NOTE — Telephone Encounter (Signed)
  Follow up Call-  No flowsheet data found.   Patient questions:  Do you have a fever, pain , or abdominal swelling? No. Pain Score  0 *  Have you tolerated food without any problems? Yes.    Have you been able to return to your normal activities? Yes.    Do you have any questions about your discharge instructions: Diet   No. Medications  No. Follow up visit  No.  Do you have questions or concerns about your Care? No.  Actions: * If pain score is 4 or above: No action needed, pain <4.  I spoke with pt's husband, since pt speaks spanish.  Husband speaks some english.  No problems noted per pt's husband. maw

## 2017-05-30 ENCOUNTER — Ambulatory Visit (INDEPENDENT_AMBULATORY_CARE_PROVIDER_SITE_OTHER): Payer: Medicare Other

## 2017-05-30 ENCOUNTER — Encounter: Payer: Self-pay | Admitting: Urgent Care

## 2017-05-30 ENCOUNTER — Ambulatory Visit (INDEPENDENT_AMBULATORY_CARE_PROVIDER_SITE_OTHER): Payer: Medicare Other | Admitting: Urgent Care

## 2017-05-30 VITALS — BP 133/70 | HR 72 | Temp 98.0°F | Resp 18 | Ht 61.5 in | Wt 145.0 lb

## 2017-05-30 DIAGNOSIS — M5136 Other intervertebral disc degeneration, lumbar region: Secondary | ICD-10-CM | POA: Diagnosis not present

## 2017-05-30 DIAGNOSIS — M1711 Unilateral primary osteoarthritis, right knee: Secondary | ICD-10-CM | POA: Diagnosis not present

## 2017-05-30 DIAGNOSIS — M25561 Pain in right knee: Secondary | ICD-10-CM

## 2017-05-30 DIAGNOSIS — G8929 Other chronic pain: Secondary | ICD-10-CM

## 2017-05-30 DIAGNOSIS — M810 Age-related osteoporosis without current pathological fracture: Secondary | ICD-10-CM

## 2017-05-30 DIAGNOSIS — Z9889 Other specified postprocedural states: Secondary | ICD-10-CM

## 2017-05-30 LAB — POCT GLYCOSYLATED HEMOGLOBIN (HGB A1C): HEMOGLOBIN A1C: 6.2

## 2017-05-30 MED ORDER — DICLOFENAC SODIUM 1 % TD GEL: TRANSDERMAL | 0 refills | Status: AC

## 2017-05-30 MED ORDER — METHYLPREDNISOLONE ACETATE 80 MG/ML IJ SUSP
80.0000 mg | Freq: Once | INTRAMUSCULAR | Status: AC
  Administered 2017-05-30: 80 mg via INTRAMUSCULAR

## 2017-05-30 NOTE — Patient Instructions (Addendum)
Arthritis Arthritis is a term that is commonly used to refer to joint pain or joint disease. There are more than 100 types of arthritis. What are the causes? The most common cause of this condition is wear and tear of a joint. Other causes include:  Gout.  Inflammation of a joint.  An infection of a joint.  Sprains and other injuries near the joint.  A drug reaction or allergic reaction.  In some cases, the cause may not be known. What are the signs or symptoms? The main symptom of this condition is pain in the joint with movement. Other symptoms include:  Redness, swelling, or stiffness at a joint.  Warmth coming from the joint.  Fever.  Overall feeling of illness.  How is this diagnosed? This condition may be diagnosed with a physical exam and tests, including:  Blood tests.  Urine tests.  Imaging tests, such as MRI, X-rays, or a CT scan.  Sometimes, fluid is removed from a joint for testing. How is this treated? Treatment for this condition may involve:  Treatment of the cause, if it is known.  Rest.  Raising (elevating) the joint.  Applying cold or hot packs to the joint.  Medicines to improve symptoms and reduce inflammation.  Injections of a steroid such as cortisone into the joint to help reduce pain and inflammation.  Depending on the cause of your arthritis, you may need to make lifestyle changes to reduce stress on your joint. These changes may include exercising more and losing weight. Follow these instructions at home: Medicines  Take over-the-counter and prescription medicines only as told by your health care provider.  Do not take aspirin to relieve pain if gout is suspected. Activity  Rest your joint if told by your health care provider. Rest is important when your disease is active and your joint feels painful, swollen, or stiff.  Avoid activities that make the pain worse. It is important to balance activity with rest.  Exercise your  joint regularly with range-of-motion exercises as told by your health care provider. Try doing low-impact exercise, such as: ? Swimming. ? Water aerobics. ? Biking. ? Walking. Joint Care   If your joint is swollen, keep it elevated if told by your health care provider.  If your joint feels stiff in the morning, try taking a warm shower.  If directed, apply heat to the joint. If you have diabetes, do not apply heat without permission from your health care provider. ? Put a towel between the joint and the hot pack or heating pad. ? Leave the heat on the area for 20-30 minutes.  If directed, apply ice to the joint: ? Put ice in a plastic bag. ? Place a towel between your skin and the bag. ? Leave the ice on for 20 minutes, 2-3 times per day.  Keep all follow-up visits as told by your health care provider. This is important. Contact a health care provider if:  The pain gets worse.  You have a fever. Get help right away if:  You develop severe joint pain, swelling, or redness.  Many joints become painful and swollen.  You develop severe back pain.  You develop severe weakness in your leg.  You cannot control your bladder or bowels. This information is not intended to replace advice given to you by your health care provider. Make sure you discuss any questions you have with your health care provider. Document Released: 11/28/2004 Document Revised: 03/28/2016 Document Reviewed: 01/16/2015 Elsevier Interactive Patient   Education  2018 Elsevier Inc.     IF you received an x-ray today, you will receive an invoice from [facility]. Please contact [facility] at [phone] with questions or concerns regarding your invoice.   IF you received labwork today, you will receive an invoice from [facility]. Please contact [facility] at [phone] with questions or concerns regarding your invoice.   Our billing staff will not be able to assist you with questions  regarding bills from these companies.  You will be contacted with the lab results as soon as they are available. The fastest way to get your results is to activate your My Chart account. Instructions are located on the last page of this paperwork. If you have not heard from us regarding the results in 2 weeks, please contact this office.      

## 2017-05-30 NOTE — Progress Notes (Signed)
MRN: [id number] DOB: 06/16/1941  Subjective:   Carrie Floyd is a 76 y.o. female presenting for chief complaint of Leg Pain (entire right leg pain for 2 weeks; )  Reports 2 week history of persistent right knee pain. Has had associated swelling. Reports history of PUD. Has tried APAP, topical creams. She was prescribed tramadol previously, had significant side effects including dizziness, nausea. She is not interested in taking pills. She was previously [place] by [place] physician [place] and would receive injections of unknown medication with good results. Denies fever, erythema, trauma, knee buckling. Admits history of osteoporosis, DDD of her back, ruptured disc, sciatica. Has a history of several accidental falls, history of back surgery, last done 1980. Patient does not hydrate well, drinks mainly fruit juices and coconut water. Denies smoking cigarettes.  Carrie Floyd has a current medication list which includes the following prescription(s): aspirin ec, levothyroxine, lisinopril-hydrochlorothiazide, and potassium. Also has No Known Allergies.  Carrie Floyd  has a past medical history of Hyperlipidemia; Hypertension; Osteoporosis; Seizures (HCC); Stroke [facility](HCC); and Thyroid disease. Also  has a past surgical history that includes Eye surgery; Fracture surgery; and Spine surgery. Her family history includes Hyperlipidemia in her sister; Hypertension in her sister.   Objective:   Vitals: BP 133/70   Pulse 72   Temp 98 F (36.7 C) (Oral)   Resp 18   Ht 5' 1.5" (1.562 m)   Wt 145 lb (65.8 kg)   SpO2 96%   BMI 26.95 kg/m   Physical Exam  Constitutional: She is oriented to person, place, and time. She appears well-developed and well-nourished.  Cardiovascular: Normal rate.   Pulmonary/Chest: Effort normal.  Musculoskeletal:       Right knee: She exhibits decreased range of motion (flexion) and swelling (trace over lateral and superior-medial aspect of knee). She exhibits no ecchymosis, no deformity, no  erythema, normal alignment, normal patellar mobility and no bony tenderness. Tenderness found. Medial joint line and lateral joint line tenderness noted. No MCL, no LCL and no patellar tendon tenderness noted.  Neurological: She is alert and oriented to person, place, and time. She displays normal reflexes.   Results for orders placed or performed in visit on 05/30/17 (from the past 24 hour(s))  POCT glycosylated hemoglobin (Hb A1C)     Status: None   Collection Time: 05/30/17 12:23 PM  Result Value Ref Range   Hemoglobin A1C 6.2    Dg Knee Complete 4 Views Right  Result Date: 05/30/2017 CLINICAL DATA:  Chronic right knee pain. EXAM: RIGHT KNEE - COMPLETE 4+ VIEW COMPARISON:  None. FINDINGS: No fracture or dislocation. Suspected mild tricompartmental degenerative change of the knee, potentially worse with the patellofemoral joint with articular surface irregularity and osteophytosis. Small knee joint effusion. Minimal spurring the tibial spines. No evidence of chondrocalcinosis. Regional soft tissues appear normal. IMPRESSION: 1. Small knee joint effusion.  Otherwise, no acute findings. 2. Mild tricompartmental degenerative change of the knee, likely worse within the patellofemoral joint. Electronically Signed   By: Simonne ComeJohn  Watts M.D.   On: 05/30/2017 12:01   Assessment and Plan :   1. Osteoarthritis of right knee, unspecified osteoarthritis type 2. Chronic pain of right knee 3. Degenerative disc disease, lumbar 4. History of back surgery 5. Osteoporosis without current pathological fracture, unspecified osteoporosis type - Patient refused oral therapy and local knee injection. She agreed to IM Depomedrol. A1c check was done to r/o diabetes prior to this injection. Counseled patient that she is to use diclofenac 1% gel to her  right knee in the future. Follow up if knee pain persists.  Wallis BambergMario Haston Casebolt, PA-C Primary Care at [facility] [phone] 05/30/2017  11:24 AM

## 2017-06-03 ENCOUNTER — Telehealth: Payer: Self-pay

## 2017-06-03 NOTE — Telephone Encounter (Signed)
PA started for Diclofenac Gel  APPROVED

## 2017-06-04 NOTE — Telephone Encounter (Signed)
Thank you :)

## 2017-06-28 ENCOUNTER — Encounter: Payer: Self-pay | Admitting: Family Medicine

## 2017-06-28 ENCOUNTER — Ambulatory Visit (INDEPENDENT_AMBULATORY_CARE_PROVIDER_SITE_OTHER): Payer: Medicare Other | Admitting: Family Medicine

## 2017-06-28 VITALS — BP 156/70 | HR 69 | Temp 98.0°F | Resp 16 | Ht 61.5 in | Wt 142.2 lb

## 2017-06-28 DIAGNOSIS — J040 Acute laryngitis: Secondary | ICD-10-CM | POA: Diagnosis not present

## 2017-06-28 DIAGNOSIS — Z8711 Personal history of peptic ulcer disease: Secondary | ICD-10-CM

## 2017-06-28 DIAGNOSIS — M1711 Unilateral primary osteoarthritis, right knee: Secondary | ICD-10-CM | POA: Diagnosis not present

## 2017-06-28 DIAGNOSIS — R05 Cough: Secondary | ICD-10-CM | POA: Diagnosis not present

## 2017-06-28 DIAGNOSIS — R059 Cough, unspecified: Secondary | ICD-10-CM

## 2017-06-28 NOTE — Patient Instructions (Addendum)
Cepacol lozenges  Continue Robitussin for cough  Take Tylenol arthritis for pain   IF you received an x-ray today, you will receive an invoice from [facility]. Please contact [facility] at [phone] with questions or concerns regarding your invoice.   IF you received labwork today, you will receive an invoice from [facility]. Please contact [facility] at [phone] with questions or concerns regarding your invoice.   Our billing staff will not be able to assist you with questions regarding bills from these companies.  You will be contacted with the lab results as soon as they are available. The fastest way to get your results is to activate your My Chart account. Instructions are located on the last page of this paperwork. If you have not heard from [place] regarding the results in 2 weeks, please contact this office.     Laringitis (Laryngitis) La laringitis es la inflamacin de las cuerdas vocales. Esto provoca ronquera, tos, prdida de la voz, dolor de garganta o sequedad en la garganta. Las cuerdas vocales son dos pares de msculos que se encuentran en la garganta. Al hablar, estas cuerdas se juntan y vibran. Estas vibraciones salen por la boca como sonido. Cuando las cuerdas vocales se inflaman, la voz suena diferente. La laringitis puede ser temporal (aguda) o de larga duracin (crnica). La [employer] de los casos de laringitis aguda mejoran con [place]. La laringitis crnica es la laringitis que dura ms de tres semanas. CAUSAS Las causas de la laringitis aguda pueden ser las siguientes:  Infecciones virales.  Hablar, gritar o cantar mucho. Esto se denomina tambin esfuerzo vocal.  Infecciones bacterianas. Las causas de la laringitis crnica pueden ser las siguientes:  Esfuerzo vocal.  Lesin en las cuerdas vocales.  Reflujo cido (enfermedad por reflujo gastroesofgico o ERGE).  Alergias.  Infecciones de los senos paranasales.  Fumar.  El consumo  excesivo de alcohol.  Respirar sustancias qumicas o polvo.  Tumores en las cuerdas vocales. FACTORES DE RIESGO Entre los factores de riesgo de la laringitis se incluyen los siguientes:  Fumar.  El consumo excesivo de alcohol.  Tener alergias. SIGNOS Y SNTOMAS Los sntomas de la laringitis pueden incluir los siguientes:  Voz baja y ronca.  Prdida de la voz.  Tos seca  Dolor de Advertising copywriter.  Nariz tapada. DIAGNSTICO La laringitis puede diagnosticarse mediante:  Examen fsico.  Cultivo de secreciones de la garganta.  Anlisis de [place].  Laringoscopia. Este procedimiento le permite al mdico observar las cuerdas vocales con un espejo o un tubo de observacin. TRATAMIENTO El tratamiento de la laringitis depende de su causa. Por lo general, incluye descansar la voz y usar medicamentos para Conservation officer, nature. Sin embargo, si la laringitis se debe a una infeccin bacteriana, es posible que deba tomar antibiticos. Si la laringitis se debe a un tumor, es posible que deba someterse a un procedimiento para extirparlo. INSTRUCCIONES PARA EL CUIDADO EN EL HOGAR  Beba suficiente lquido para mantener la orina clara o de color amarillo plido.  Inhale aire hmedo. Use un humidificador si vive en un clima seco.  Tome los medicamentos solamente como se lo haya indicado el mdico.  Si le recetaron antibiticos, asegrese de terminarlos, incluso si comienza a sentirse mejor.  Nofume cigarrillos ni cigarrillos electrnicos. Si necesita ayuda para dejar de fumar, consulte al mdico.  Hable lo menos posible. Evite tambin susurrar ya que puede provocar esfuerzo vocal.  Marcelino [place] en vez de hablar. Hgalo hasta que su voz vuelva a la normalidad. SOLICITE ATENCIN MDICA  SI:  Tiene fiebre.  Aumenta el dolor.  Tiene dificultad para tragar. SOLICITE ATENCIN MDICA DE INMEDIATO SI:  Tose y escupe sangre.  Tiene dificultad para respirar. Esta informacin no tiene Microbiologist el consejo del mdico. Asegrese de hacerle al mdico cualquier pregunta que tenga. Document Released: 07/31/2005 Document Revised: 11/11/2014 Document Reviewed: 04/05/2014 Elsevier Interactive Patient Education  [employer].

## 2017-06-28 NOTE — Progress Notes (Signed)
Chief Complaint  Patient presents with  . Cough    onset: 3-4 days, taking robitussin for cough, no other respiratory symptoms per pt  . Leg Pain     knee, intermittent, seen before for this and injection given    HPI   Stratus Translator ID [id number]  Cough Pt reports that she has a cough and sore throat that started 3 days ago  The cough is nonproductive  She reports that she lost her voice from the coughing She denies fevers, chills, shortness or breath, asthma She tried some Robitussin for her symptoms and also took   PUD and Right Knee OA She also has rightleg pain in the right knee She reports that 2 months ago she started having pain in her right knee  She was given an injection of depo-medrol  She reports that it has not helped her She is not taken anything and she reports that she has a stomach ulcer She has not had any recent bleeding and was on PPI She was diagnosed with a capsule study 05/21/2017 showed a superficial gastric ulcer  Past Medical History:  Diagnosis Date  . Hyperlipidemia   . Hypertension   . Osteoporosis   . Seizures (HCC)    younger than 42  . Stroke [facility])    more than 15 yrs ago  . Thyroid disease     Current Outpatient Prescriptions  Medication Sig Dispense Refill  . levothyroxine (SYNTHROID, LEVOTHROID) 75 MCG tablet Take 75 mcg by mouth daily before breakfast.    . lisinopril-hydrochlorothiazide (PRINZIDE,ZESTORETIC) 20-25 MG tablet Take 1 tablet by mouth daily.    Marland Kitchen POTASSIUM PO Take 99 mg by mouth.    Marland Kitchen aspirin EC 81 MG tablet Take 81 mg by mouth daily.    . diclofenac sodium (VOLTAREN) 1 % GEL Apply 4g (4 inch line of gel) gel to right knee for osteoarthritic pain up to 4 times daily. (Patient not taking: Reported on 06/28/2017) 100 g 0   No current facility-administered medications for this visit.     Allergies: No Known Allergies  Past Surgical History:  Procedure Laterality Date  . EYE SURGERY    . FRACTURE SURGERY    .  SPINE SURGERY      Social History   Social History  . Marital status: Married    Spouse name: N/A  . Number of children: 3  . Years of education: N/A   Social History Main Topics  . Smoking status: Never Smoker  . Smokeless tobacco: Never Used  . Alcohol use No  . Drug use: No  . Sexual activity: Not Currently    Partners: Male   Other Topics Concern  . None   Social History Narrative  . None    ROS Review of Systems See HPI Constitution: No fevers or chills No malaise No diaphoresis Skin: No rash or itching Eyes: no blurry vision, no double vision GU: no dysuria or hematuria Neuro: no dizziness or headaches See hpi   Objective: Vitals:   06/28/17 1036  BP: (!) 156/70  Pulse: 69  Resp: 16  Temp: 98 F (36.7 C)  TempSrc: Oral  SpO2: 93%  Weight: 142 lb 3.2 oz (64.5 kg)  Height: 5' 1.5" (1.562 m)   Physical Exam General: alert, oriented, in NAD Head: normocephalic, atraumatic, no sinus tenderness Eyes: EOM intact, no scleral icterus or conjunctival injection Ears: TM clear bilaterally Nose: mucosa nonerythematous, nonedematous Throat: no pharyngeal exudate or erythema Lymph: no posterior auricular, submental  or cervical lymph adenopathy Heart: normal rate, normal sinus rhythm, no murmurs Lungs: clear to auscultation bilaterally, no wheezing Knees: tender along the medial joint line Flexion and extension causes pain No effusion at this time   CLINICAL DATA:  Chronic right knee pain.  EXAM: RIGHT KNEE - COMPLETE 4+ VIEW  COMPARISON:  None.  FINDINGS: No fracture or dislocation. Suspected mild tricompartmental degenerative change of the knee, potentially worse with the patellofemoral joint with articular surface irregularity and osteophytosis. Small knee joint effusion. Minimal spurring the tibial spines. No evidence of chondrocalcinosis. Regional soft tissues appear normal.  IMPRESSION: 1. Small knee joint effusion.  Otherwise, no  acute findings. 2. Mild tricompartmental degenerative change of the knee, likely worse within the patellofemoral joint.   Electronically Signed   By: Simonne Come M.D.   On: 05/30/2017 12:01     Assessment and Plan Camil was seen today for cough and leg pain.  Diagnoses and all orders for this visit: Cough- continue Robitussin for cough, likely viral  Laryngitis- supportive care with lozenges  Osteoarthritis of right knee, unspecified osteoarthritis type- she will be referred to Orthopedic Surgery for consultation given that she does not want to take NSAIDs due to history of PUD, she also does not want intraarticular injection -     Ambulatory referral to Orthopedic Surgery  History of peptic ulcer disease Peptic ulcer resolved without any recent bleed Pt is very concerned and declines oral antiinflammatory meds She is hesistant to get any intraarticular injection    A total of 30 minutes were spent face-to-face with the patient during this encounter and over half of that time was spent on counseling and coordination of care.  Anjenette Gerbino A Elania Crowl

## 2017-07-09 ENCOUNTER — Ambulatory Visit: Payer: Medicare Other | Admitting: Internal Medicine

## 2017-08-06 ENCOUNTER — Other Ambulatory Visit: Payer: Self-pay | Admitting: Nurse Practitioner

## 2017-08-08 ENCOUNTER — Encounter: Payer: Self-pay | Admitting: Emergency Medicine

## 2017-08-08 ENCOUNTER — Ambulatory Visit (INDEPENDENT_AMBULATORY_CARE_PROVIDER_SITE_OTHER): Payer: Medicare Other | Admitting: Emergency Medicine

## 2017-08-08 VITALS — BP 124/78 | HR 89 | Temp 98.8°F | Resp 17 | Ht 61.5 in | Wt 143.0 lb

## 2017-08-08 DIAGNOSIS — M1711 Unilateral primary osteoarthritis, right knee: Secondary | ICD-10-CM | POA: Diagnosis not present

## 2017-08-08 DIAGNOSIS — G8929 Other chronic pain: Secondary | ICD-10-CM

## 2017-08-08 DIAGNOSIS — M25561 Pain in right knee: Secondary | ICD-10-CM | POA: Diagnosis not present

## 2017-08-08 NOTE — Patient Instructions (Addendum)
  Take Tramadol (prescribed earlier) as needed for pain. F/U with Orthopedics.  Dolor de rodilla (Knee Pain) El dolor de rodilla es un problema frecuente y puede tener muchas causas. A menudo desaparece si se siguen las instrucciones del mdico para el cuidado Facilities manager. El tratamiento del dolor continuo depender de su causa. Si el dolor persiste, tal vez haya que realizar ms estudios para Scientist, forensic, los cuales pueden incluir radiografas u otros estudios de diagnstico por imgenes de la rodilla. CUIDADOS EN EL HOGAR  Tome los medicamentos solamente como se lo haya indicado el mdico.  Mantenga la rodilla en reposo y en alto (elevada) mientras est descansando.  No haga cosas que le causen dolor o que lo intensifiquen.  Evite las [employer] ambos pies se separan del suelo al mismo tiempo, por ejemplo, correr, saltar la soga o hacer saltos de tijera.  Aplique hielo sobre la zona de la rodilla: ? Ponga el hielo en una bolsa plstica. ? Coloque una [employer] piel y la bolsa de hielo. ? Coloque el hielo durante 20 minutos, 2 a 3 veces por da.  Pregntele al mdico si debe usar una Neurosurgeon.  Duerma con una almohada debajo de la rodilla.  Baje de peso si es necesario. El sobrepeso puede aumentar el dolor de rodilla.  No consuma ningn producto que contenga tabaco, lo que incluye cigarrillos, tabaco de Theatre manager o Administrator, Civil Service. Si necesita ayuda para dejar de fumar, consulte al mdico. Fumar puede retrasar la curacin de cualquier problema que tenga en el hueso y Nurse, learning disability.  SOLICITE AYUDA SI:  El dolor de rodilla no desaparece, cambia o empeora.  Tiene fiebre junto con dolor de rodilla.  La rodilla le falla o se le queda trabada.  La rodilla est ms hinchada.  SOLICITE AYUDA DE INMEDIATO SI:  La rodilla est caliente al tacto.  Tiene dolor en el pecho o dificultad para respirar.  Esta informacin no tiene Public house manager el consejo del mdico. Asegrese de hacerle al mdico cualquier pregunta que tenga. Document Released: 05/18/2014 Document Revised: 05/18/2014 Document Reviewed: 12/22/2013 Elsevier Interactive Patient Education  2017 [employer].    IF you received an x-ray today, you will receive an invoice from [facility]. Please contact [facility] at [phone] with questions or concerns regarding your invoice.   IF you received labwork today, you will receive an invoice from [facility]. Please contact [facility] at [phone] with questions or concerns regarding your invoice.   Our billing staff will not be able to assist you with questions regarding bills from these companies.  You will be contacted with the lab results as soon as they are available. The fastest way to get your results is to activate your My Chart account. Instructions are located on the last page of this paperwork. If you have not heard from [place] regarding the results in 2 weeks, please contact this office.

## 2017-08-08 NOTE — Progress Notes (Signed)
Carrie Floyd 76 y.o.   Chief Complaint  Patient presents with  . Knee Pain    right   . Leg Pain    HISTORY OF PRESENT ILLNESS: This is a 76 y.o. 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 No new trauma; had knee x-ray done before and it showed some arthritis; referred to Ortho last time but was not able to make appointment. Still has some leftover Tramadol from 03/05/17 but hasn't tried it yet. It worked before for the chest wall muscle strain.  HPI   Prior to Admission medications   Medication Sig Start Date End Date Taking? Authorizing Provider  aspirin EC 81 MG tablet Take 81 mg by mouth daily.   Yes [provider]  diclofenac sodium (VOLTAREN) 1 % GEL Apply 4g (4 inch line of gel) gel to right knee for osteoarthritic pain up to 4 times daily. 05/30/17  Yes Wallis Bamberg, PA-C  levothyroxine (SYNTHROID, LEVOTHROID) 75 MCG tablet Take 75 mcg by mouth daily before breakfast.   Yes [provider]  lisinopril-hydrochlorothiazide (PRINZIDE,ZESTORETIC) 20-25 MG tablet Take 1 tablet by mouth daily.   Yes [provider]  POTASSIUM PO Take 99 mg by mouth.   Yes [provider]    No Known Allergies  Patient Active Problem List   Diagnosis Date Noted  . Essential hypertension 01/29/2017  . Thyroid condition 01/29/2017  . Bilateral carotid bruits 01/29/2017  . Osteoporosis 01/29/2017    Past Medical History:  Diagnosis Date  . Hyperlipidemia   . Hypertension   . Osteoporosis   . Seizures (HCC)    younger than 52  . Stroke [facility])    more than 15 yrs ago  . Thyroid disease     Past Surgical History:  Procedure Laterality Date  . EYE SURGERY    . FRACTURE SURGERY    . SPINE SURGERY      Social History   Social History  . Marital status: Married    Spouse name: N/A  .  Number of children: 3  . Years of education: N/A   Occupational History  . Not on file.   Social History Main Topics  . Smoking status: Never Smoker  . Smokeless tobacco: Never Used  . Alcohol use No  . Drug use: No  . Sexual activity: Not Currently    Partners: Male   Other Topics Concern  . Not on file   Social History Narrative  . No narrative on file    Family History  Problem Relation Age of Onset  . Hyperlipidemia Sister   . Hypertension Sister   . Colon cancer Neg Hx   . Stomach cancer Neg Hx      Review of Systems  Constitutional: Negative.  Negative for chills and fever.  Respiratory: Negative for shortness of breath.   Cardiovascular: Negative for leg swelling.  Gastrointestinal: Negative for nausea and vomiting.  Genitourinary: Negative for hematuria.  Musculoskeletal: Positive for back pain and joint pain (right knee).  Skin: Negative.  Negative for rash.  Neurological: Negative for tingling, sensory change and focal weakness.  Endo/Heme/Allergies: Negative.   All other systems reviewed and are negative.  Vitals:   08/08/17 0952  BP: 124/78  Pulse: 89  Resp: 17  Temp: 98.8 F (37.1 C)  SpO2: 98%     Physical Exam  Constitutional: She is oriented to person, place, and time. She appears well-developed and well-nourished.  HENT:  Head: Normocephalic.  Eyes: Pupils are equal, round, and reactive to light.  Neck: Normal range of motion.  Cardiovascular: Normal rate.   Pulmonary/Chest: Effort normal.  Musculoskeletal:  Right knee: no erythema or swelling but c/o pain during ROM.  Neurological: She is alert and oriented to person, place, and time. No sensory deficit. She exhibits normal muscle tone.  Skin: Skin is warm and dry. Capillary refill takes less than 2 seconds.  Psychiatric: She has a normal mood and affect. Her behavior is normal.  Vitals reviewed.    ASSESSMENT & PLAN: Carrie Floyd was seen today for knee pain and leg  pain.  Diagnoses and all orders for this visit:  Chronic pain of right knee -     Ambulatory referral to Orthopedic Surgery  Osteoarthritis of right knee, unspecified osteoarthritis type -     Ambulatory referral to Orthopedic Surgery    Patient Instructions    Take Tramadol (prescribed earlier) as needed for pain. F/U with Orthopedics.  Dolor de rodilla (Knee Pain) El dolor de rodilla es un problema frecuente y puede tener muchas causas. A menudo desaparece si se siguen las instrucciones del mdico para el cuidado Facilities manager. El tratamiento del dolor continuo depender de su causa. Si el dolor persiste, tal vez haya que realizar ms estudios para Scientist, forensic, los cuales pueden incluir radiografas u otros estudios de diagnstico por imgenes de la rodilla. CUIDADOS EN EL HOGAR  Tome los medicamentos solamente como se lo haya indicado el mdico.  Mantenga la rodilla en reposo y en alto (elevada) mientras est descansando.  No haga cosas que le causen dolor o que lo intensifiquen.  Evite las [employer] ambos pies se separan del suelo al mismo tiempo, por ejemplo, correr, saltar la soga o hacer saltos de tijera.  Aplique hielo sobre la zona de la rodilla: ? Ponga el hielo en una bolsa plstica. ? Coloque una [employer] piel y la bolsa de hielo. ? Coloque el hielo durante 20 minutos, 2 a 3 veces por da.  Pregntele al mdico si debe usar una Neurosurgeon.  Duerma con una almohada debajo de la rodilla.  Baje de peso si es necesario. El sobrepeso puede aumentar el dolor de rodilla.  No consuma ningn producto que contenga tabaco, lo que incluye cigarrillos, tabaco de Theatre manager o Administrator, Civil Service. Si necesita ayuda para dejar de fumar, consulte al mdico. Fumar puede retrasar la curacin de cualquier problema que tenga en el hueso y Nurse, learning disability.  SOLICITE AYUDA SI:  El dolor de rodilla no desaparece, cambia o empeora.  Tiene  fiebre junto con dolor de rodilla.  La rodilla le falla o se le queda trabada.  La rodilla est ms hinchada.  SOLICITE AYUDA DE INMEDIATO SI:  La rodilla est caliente al tacto.  Tiene dolor en el pecho o dificultad para respirar.  Esta informacin no tiene Theme park manager el consejo del mdico. Asegrese de hacerle al mdico cualquier pregunta que tenga. Document Released: 05/18/2014 Document Revised: 05/18/2014 Document Reviewed: 12/22/2013 Elsevier Interactive Patient Education  2017 [employer].    IF you received an x-ray today, you will receive an invoice from [facility]. Please contact [facility] at [phone] with questions or concerns regarding your invoice.   IF you received labwork today, you will receive an invoice from  [facility]. Please contact [facility] at [phone] with questions or concerns regarding your invoice.   Our billing staff will not be able to assist you with questions regarding bills from these companies.  You will be contacted with the lab results as soon as they are available. The fastest way to get your results is to activate your My Chart account. Instructions are located on the last page of this paperwork. If you have not heard from [place] regarding the results in 2 weeks, please contact this office.          Edwina Barth, MD [facility]

## 2017-08-12 ENCOUNTER — Ambulatory Visit (INDEPENDENT_AMBULATORY_CARE_PROVIDER_SITE_OTHER): Payer: Medicare Other | Admitting: Orthopaedic Surgery

## 2017-08-12 ENCOUNTER — Ambulatory Visit (INDEPENDENT_AMBULATORY_CARE_PROVIDER_SITE_OTHER): Payer: Self-pay

## 2017-08-12 ENCOUNTER — Encounter (INDEPENDENT_AMBULATORY_CARE_PROVIDER_SITE_OTHER): Payer: Self-pay | Admitting: Orthopaedic Surgery

## 2017-08-12 ENCOUNTER — Ambulatory Visit (INDEPENDENT_AMBULATORY_CARE_PROVIDER_SITE_OTHER): Payer: Medicare Other

## 2017-08-12 VITALS — BP 121/51 | HR 82 | Resp 16 | Ht 61.5 in | Wt 143.0 lb

## 2017-08-12 DIAGNOSIS — G8929 Other chronic pain: Secondary | ICD-10-CM | POA: Diagnosis not present

## 2017-08-12 DIAGNOSIS — M25561 Pain in right knee: Secondary | ICD-10-CM

## 2017-08-12 DIAGNOSIS — M25551 Pain in right hip: Secondary | ICD-10-CM | POA: Diagnosis not present

## 2017-08-12 NOTE — Progress Notes (Signed)
Office Visit Note   Patient: Carrie Floyd           Date of Birth: [birth year]           MRN: [id number] Visit Date: 08/12/2017              Requested by: Georgina Quint, MD [address] [place], [place] [address] PCP: Georgina Quint, MD   Assessment & Plan: Visit Diagnoses:  1. Chronic pain of right knee   2. Pain in right hip     Plan:  #1: We will schedule her for an MRI scan of the right knee to rule out internal derangement versus arthritis.  Follow-Up Instructions: Return in about 3 weeks (around 09/02/2017).   Orders:  Orders Placed This Encounter  Procedures  . XR HIP UNILAT W OR W/O PELVIS 2-3 VIEWS RIGHT  . MR Knee Right w/o contrast   No orders of the defined types were placed in this encounter.     Procedures: No procedures performed   Clinical Data: No additional findings.   Subjective: Chief Complaint  Patient presents with  . Right Knee - Pain  . Knee Pain    Right knee pain, swelling in ankle at times, lower leg pain, giving way, no noises, weakness, difficulty walking, difficulty with steps, difficulty sleeping at night, Tylenol helps, not diabetic, no surgery    HPI  [name] is a 76 year old Hispanic female who has been seen prior by 3 medical practitioners for right knee pain. She was seen in May 30, 2017 and at that time was noted to have the most all osteoarthritis of the knee. She was offered the local knee injection and oral therapy but refused. He was given IM Depo-Medrol at that time. She was also to use diclofenac 1% gel.  She was seen again on 06/28/2017 by medicine and she did not want to take any anti-inflammatories nor does she want an intra-articular injection. She was then scheduled for referral to orthopedics.  She was again seen on 08/08/2017 without being seen by Ortho. The reason was that she was unable to make an appointment. She was again referred to [place] at this time and was seen her for the very first  time.  She's also had an injection back in [place] prior to her visits here in [facility]. Is a very difficult to understand her because of language barrier.  Review of Systems  All other systems reviewed and are negative.    Objective: Vital Signs: BP (!) 121/51 (BP Location: Right Arm, Patient Position: Sitting, Cuff Size: Normal)   Pulse 82   Resp 16   Ht 5' 1.5" (1.562 m)   Wt 143 lb (64.9 kg)   BMI 26.58 kg/m   Physical Exam  Constitutional: She is oriented to person, place, and time. She appears well-developed and well-nourished.  HENT:  Head: Normocephalic and atraumatic.  Eyes: Pupils are equal, round, and reactive to light. EOM are normal.  Pulmonary/Chest: Effort normal.  Neurological: She is alert and oriented to person, place, and time.  Skin: Skin is warm and dry.  Psychiatric: She has a normal mood and affect. Her behavior is normal. Judgment and thought content normal.    Ortho Exam  Today she has a mild effusion. No warmth or erythema. She does have a little swelling in the posterior aspect of her knee consistent with a Baker's cyst. Range of motion from near full extension to about 95. Does have little bit  of patellofemoral crepitus with range of motion. Diffuse tenderness to palpation over both the joint lines. Not particularly unstable in anterior and posterior drawers. She though has some opening with valgus stress.  Specialty Comments:  No specialty comments available.  Imaging: EXAM: RIGHT KNEE - COMPLETE 4+ VIEW  COMPARISON:  None.  FINDINGS: No fracture or dislocation. Suspected mild tricompartmental degenerative change of the knee, potentially worse with the patellofemoral joint with articular surface irregularity and osteophytosis. Small knee joint effusion. Minimal spurring the tibial spines. No evidence of chondrocalcinosis. Regional soft tissues appear normal.  IMPRESSION: 1. Small knee joint effusion.  Otherwise, no  acute findings. 2. Mild tricompartmental degenerative change of the knee, likely worse within the patellofemoral joint.   Electronically Signed   By: Simonne Come M.D.   On: 05/30/2017 12:01  PMFS History: Patient Active Problem List   Diagnosis Date Noted  . Essential hypertension 01/29/2017  . Thyroid condition 01/29/2017  . Bilateral carotid bruits 01/29/2017  . Osteoporosis 01/29/2017   Past Medical History:  Diagnosis Date  . Hyperlipidemia   . Hypertension   . Osteoporosis   . Seizures (HCC)    younger than 60  . Stroke [facility])    more than 15 yrs ago  . Thyroid disease     Family History  Problem Relation Age of Onset  . Hyperlipidemia Sister   . Hypertension Sister   . Colon cancer Neg Hx   . Stomach cancer Neg Hx     Past Surgical History:  Procedure Laterality Date  . ELBOW FRACTURE SURGERY    . EYE SURGERY    . FRACTURE SURGERY    . SPINE SURGERY     Social History   Occupational History  . Not on file.   Social History Main Topics  . Smoking status: Never Smoker  . Smokeless tobacco: Never Used  . Alcohol use No  . Drug use: No  . Sexual activity: Not Currently    Partners: Male

## 2017-09-02 ENCOUNTER — Ambulatory Visit
Admission: RE | Admit: 2017-09-02 | Discharge: 2017-09-02 | Disposition: A | Discharge status: Home / Self Care | Payer: Medicare Other | Source: Ambulatory Visit | Attending: Orthopedic Surgery | Admitting: Orthopedic Surgery

## 2017-09-11 ENCOUNTER — Encounter (INDEPENDENT_AMBULATORY_CARE_PROVIDER_SITE_OTHER): Payer: Self-pay | Admitting: Orthopaedic Surgery

## 2017-09-11 ENCOUNTER — Ambulatory Visit (INDEPENDENT_AMBULATORY_CARE_PROVIDER_SITE_OTHER): Payer: Medicare Other

## 2017-09-11 ENCOUNTER — Ambulatory Visit (INDEPENDENT_AMBULATORY_CARE_PROVIDER_SITE_OTHER): Payer: Medicare Other | Admitting: Orthopaedic Surgery

## 2017-09-11 VITALS — BP 125/67 | HR 83 | Resp 14 | Ht 64.0 in | Wt 150.0 lb

## 2017-09-11 DIAGNOSIS — M25561 Pain in right knee: Secondary | ICD-10-CM

## 2017-09-11 DIAGNOSIS — G8929 Other chronic pain: Secondary | ICD-10-CM

## 2017-09-11 NOTE — Progress Notes (Signed)
Office Visit Note   Patient: Carrie Floyd           Date of Birth: 10/21/1941           MRN: [id number] Visit Date: 09/11/2017              Requested by: Carrie QuintSagardia, Miguel Jose, MD [address] [place], [place] [id number] PCP: Carrie QuintSagardia, Miguel Jose, MD   Assessment & Plan: Visit Diagnoses:  1. Chronic pain of right knee     Plan: MRI scan demonstrates significant tearing of the lateral meniscus associated with advanced loss of cartilage in the lateral compartment where there is nearly bone-on-bone. Long discussion with Mr. Mrs. Carrie Floyd guarding the MRI scan findings and different treatment options. These would include arthroscopic debridement of the lateral meniscus. 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 Another option would be consider a total knee replacement I discussed that in some detail. Also discussed intra-articular cortisone injection and Visco supplementation. She does have a support. She does take over-the-counter medicines th at provide some relief. Total visit time was approximately 40 minutes. Mr. Mrs. Carrie Floyd would like to think about this and "get back with me  Follow-Up Instructions: Return if symptoms worsen or fail to improve.   Orders:  Orders Placed This Encounter  Procedures  . XR KNEE 3 VIEW RIGHT   No orders of the defined types were placed in this encounter.     Procedures: No procedures performed   Clinical Data: No additional findings.   Subjective: Chief Complaint  Patient presents with  . Right Knee - Pain, Results    Mrs. Carrie Floyd is a 76 y o that is here today for MRI results of Right knee.   Per prior office notes Mrs. Carrie Floyd is having predominantly lateral joint pain right knee. RI scan results are as above. HPI  Review of Systems  Constitutional: Negative for chills, fatigue and fever.  Eyes: Negative for itching.  Respiratory: Negative for  chest tightness and shortness of breath.   Cardiovascular: Negative for chest pain, palpitations and leg swelling.  Gastrointestinal: Negative for blood in stool, constipation and diarrhea.  Endocrine: Negative for polyuria.  Genitourinary: Negative for dysuria.  Musculoskeletal: Positive for back pain and neck stiffness. Negative for joint swelling and neck pain.  Allergic/Immunologic: Negative for immunocompromised state.  Neurological: Negative for dizziness and numbness.  Hematological: Does not bruise/bleed easily.  Psychiatric/Behavioral: The patient is not nervous/anxious.      Objective: Vital Signs: BP 125/67   Pulse 83   Resp 14   Ht 5\' 4"  (1.626 m)   Wt 150 lb (68 kg)   BMI 25.75 kg/m   Physical Exam  Ortho Exam awake alert and oriented 3. Comfortable sitting. Communication is a problem as she speaks little [place]. Her husband translates. Carrie Floyd diffuse lateral joint pain right knee without effusion. Minimal medial joint pain. Some patellar crepitation. Increased valgus with weightbearing. No popliteal pain. No calf pain. Neurovascular exam intact distally. No distal edema. Skin intact. Painless range of motion of both the hips. Straight leg raise negative. No specialty comments available.  Imaging: Xr Knee 3 View Right  Result Date: 09/11/2017 Films of the right knee were obtained in 3 projections standing. There are obvious degenerative changes in the lateral compartment with a decreased joint space. Approximately 9 of valgus. No ectopic calcification. Some mild degenerative changes medially and at the patellofemoral  joint.    PMFS History: Patient Active Problem List   Diagnosis Date Noted  . Essential hypertension 01/29/2017  . Thyroid condition 01/29/2017  . Bilateral carotid bruits 01/29/2017  . Osteoporosis 01/29/2017   Past Medical History:  Diagnosis Date  . Hyperlipidemia   . Hypertension   . Osteoporosis   . Seizures (HCC)    younger than 6454   . Stroke [facility](HCC)    more than 15 yrs ago  . Thyroid disease     Family History  Problem Relation Age of Onset  . Hyperlipidemia Sister   . Hypertension Sister   . Colon cancer Neg Hx   . Stomach cancer Neg Hx     Past Surgical History:  Procedure Laterality Date  . ELBOW FRACTURE SURGERY    . EYE SURGERY    . FRACTURE SURGERY    . SPINE SURGERY     Social History   Occupational History  . Not on file  Tobacco Use  . Smoking status: Never Smoker  . Smokeless tobacco: Never Used  Substance and Sexual Activity  . Alcohol use: No  . Drug use: No  . Sexual activity: Not Currently    Partners: Male

## 2017-09-12 ENCOUNTER — Ambulatory Visit (INDEPENDENT_AMBULATORY_CARE_PROVIDER_SITE_OTHER): Payer: Medicare Other | Admitting: Orthopaedic Surgery

## 2018-04-24 ENCOUNTER — Telehealth: Payer: Self-pay | Admitting: Emergency Medicine

## 2018-04-24 NOTE — Telephone Encounter (Signed)
Copied from CRM [id number]. Topic: Quick Communication - Rx Refill/Question >> Apr 24, 2018  2:17 PM Williams-Neal, Sade R wrote: Medication: levothyroxine (SYNTHROID, LEVOTHROID) 75 MCG tablet,  lisinopril-hydrochlorothiazide (PRINZIDE,ZESTORETIC) 20-25 MG tablet  Has the patient contacted their pharmacy? Yes (Agent: If no, request that the patient contact the pharmacy for the refill.) (Agent: If yes, when and what did the pharmacy advise?)  Preferred Pharmacy (with phone number or street name): [id number] - [place], [place] - [address]  Agent: Please be advised that RX refills may take up to 3 business days. We ask that you follow-up with your pharmacy.

## 2018-04-27 NOTE — Telephone Encounter (Signed)
Called home and mobile number listed for the pt but no answer at this time and unable to leave message due to mail box being full. Pt will need to schedule office visit to follow up of requested medications. Medications previously filled by historical provider.   LOV: 03/05/17 Dr. Allen KellSagardia  [facility]  [place]

## 2019-03-21 IMAGING — MR MR KNEE*R* W/O CM
4 of 6 series · 12 of 40 positions shown · non-contrast
Comparison: Plain films right knee 05/30/2017

CLINICAL DATA: Right knee pain for 3 months.  No known injury.

EXAM:
MRI OF THE RIGHT KNEE WITHOUT CONTRAST
TECHNIQUE: Multiplanar, multisequence MR imaging of the knee was performed. No
intravenous contrast was administered.

[Series 3: PD fat-sat · axial · 3.5mm · 0.21mm/px · z∈[-43,+40]mm · 3 of 28 slices shown (1 of 4)]
[im 4/28]
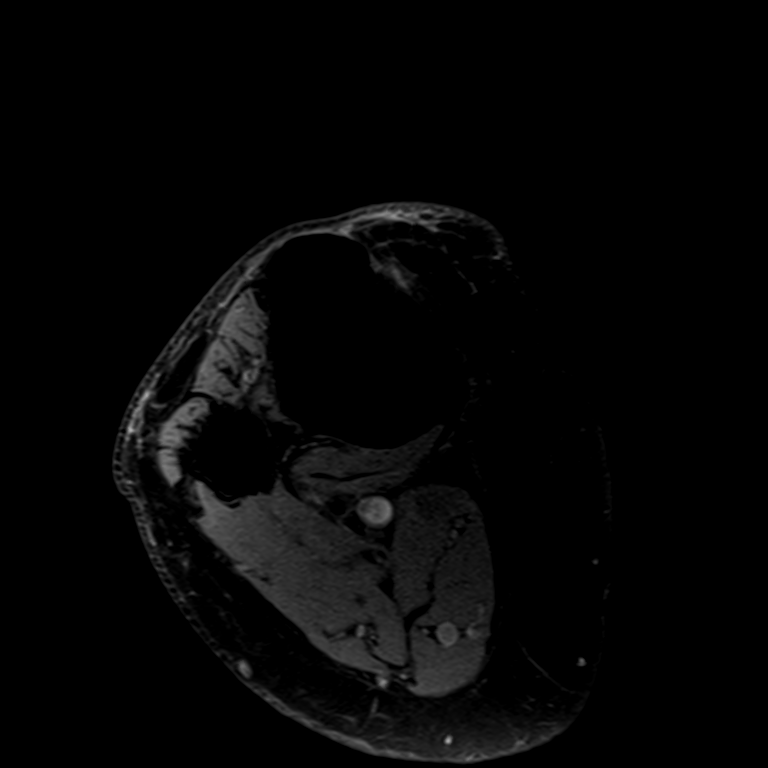
[im 16/28]
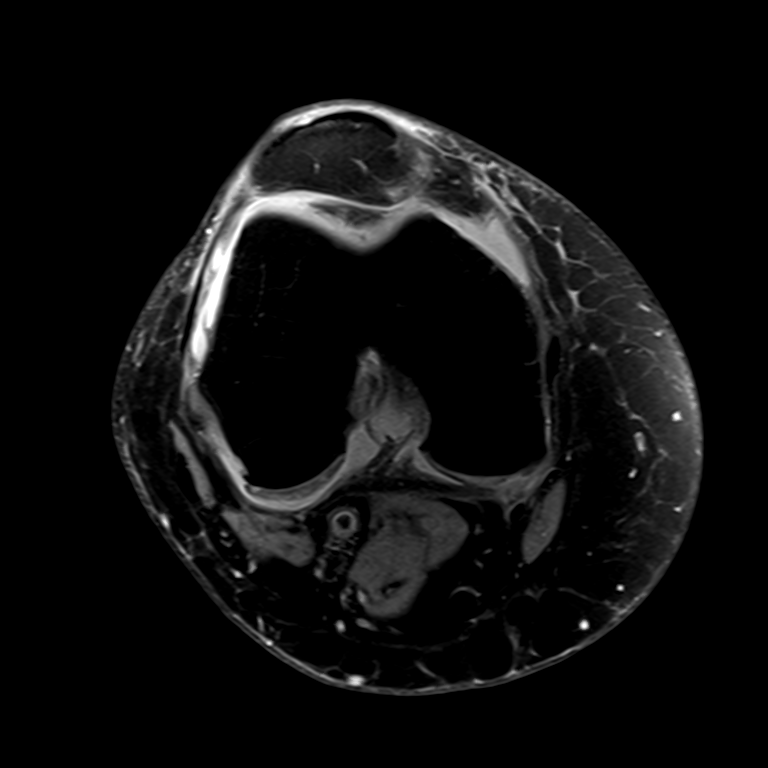
[im 24/28]
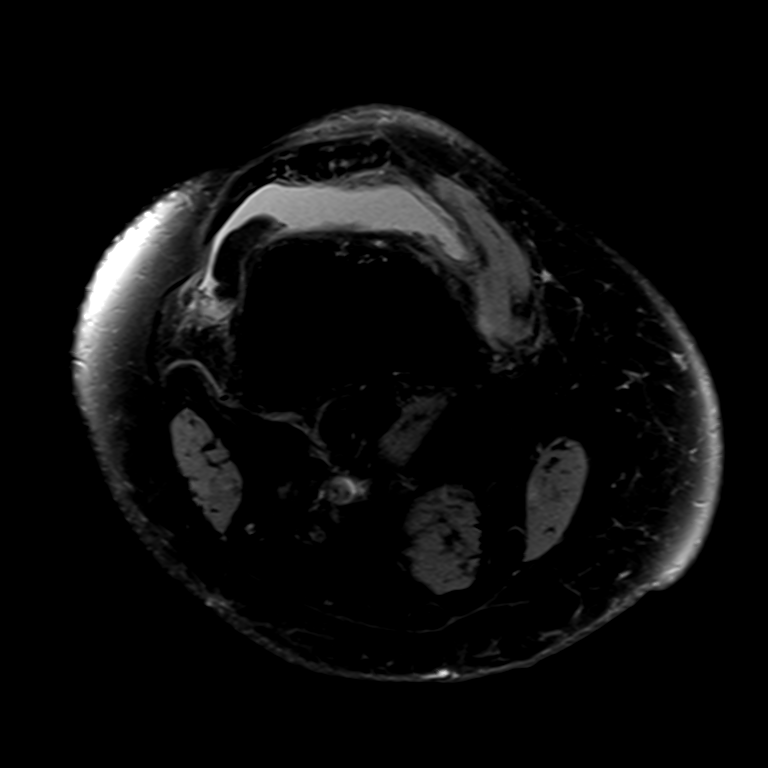

[Series 6: PD fat-sat · coronal · 3.5mm · 0.42mm/px · 3 of 28 slices shown (2 of 4)]
[im 5/28]
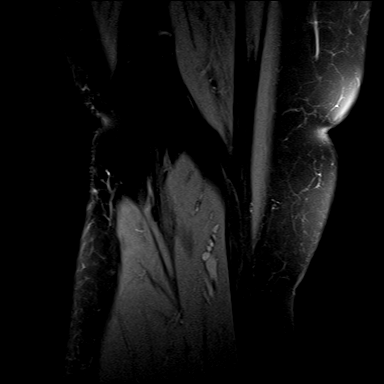
[im 14/28]
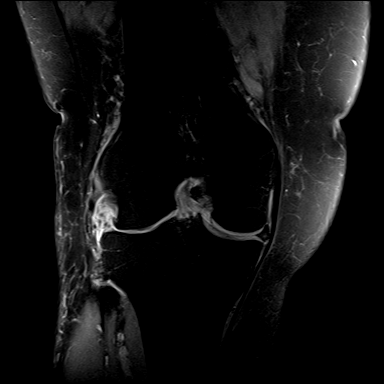
[im 23/28]
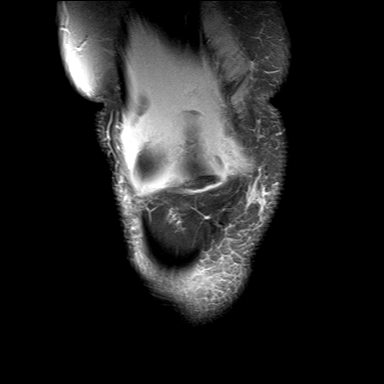

[Series 7: PD fat-sat · sagittal · 3.5mm · 0.21mm/px · 3 of 27 slices shown (3 of 4)]
[im 5/27]
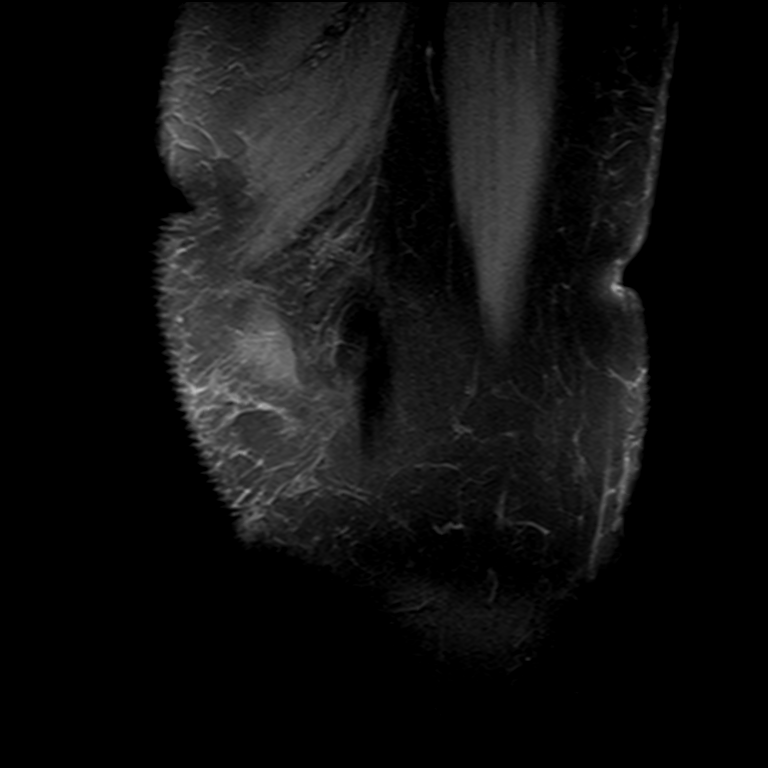
[im 14/27]
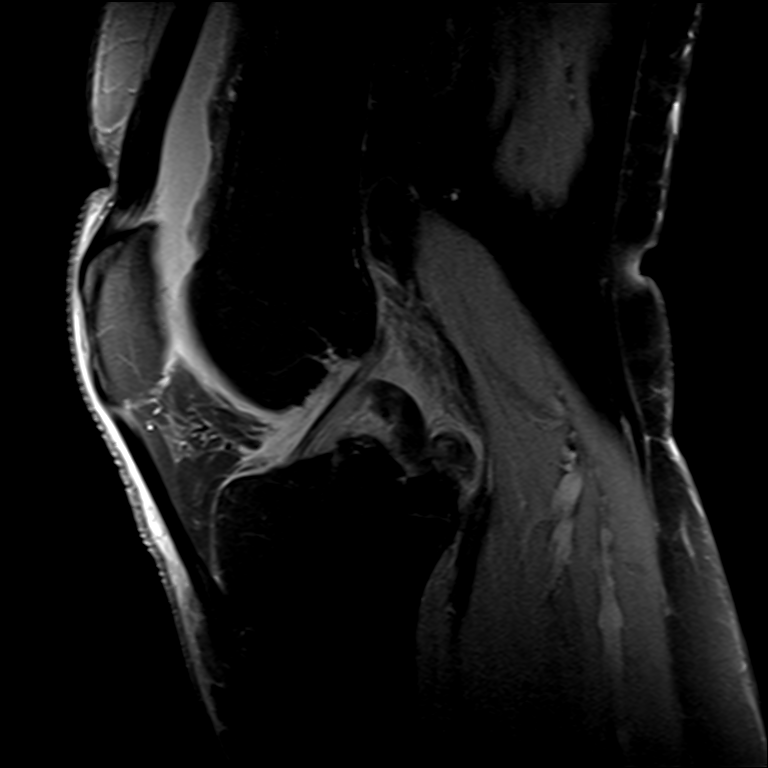
[im 22/27]
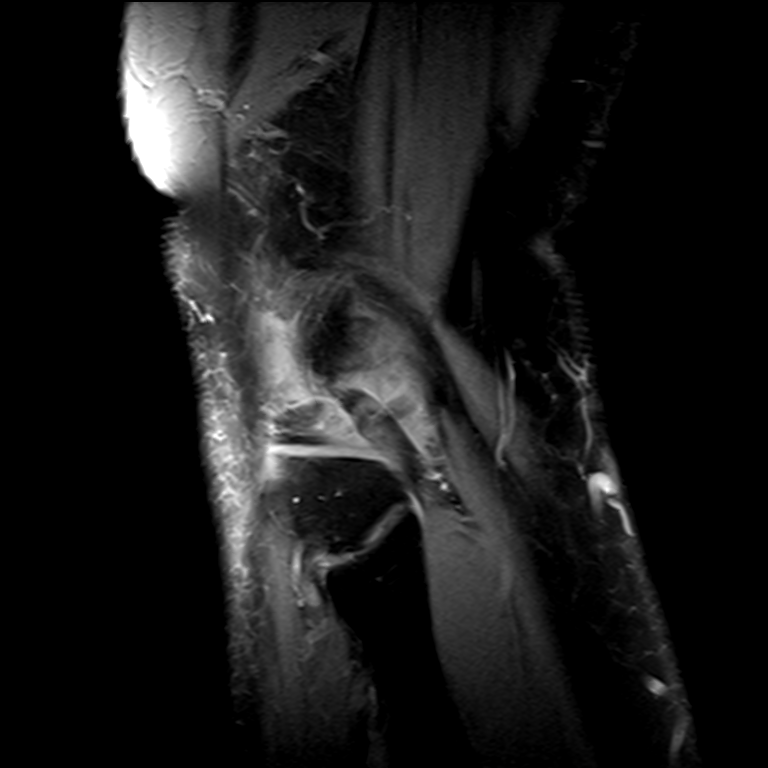

[Series 8: PD fat-sat · coronal · 2.0mm · 0.29mm/px · 3 of 15 slices shown (4 of 4)]
[im 1/15]
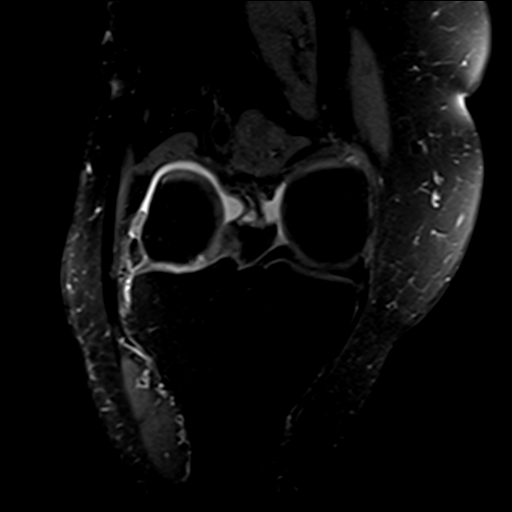
[im 10/15]
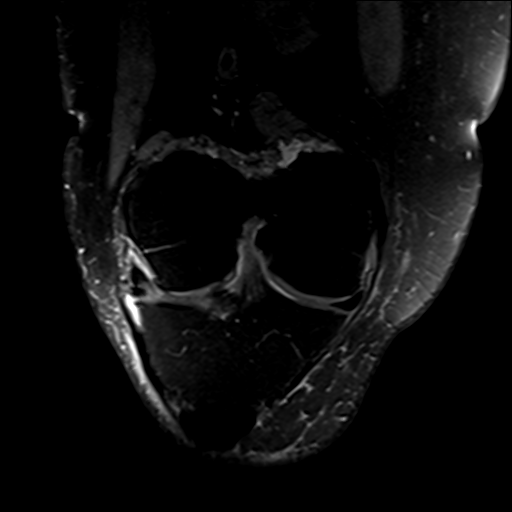
[im 15/15]
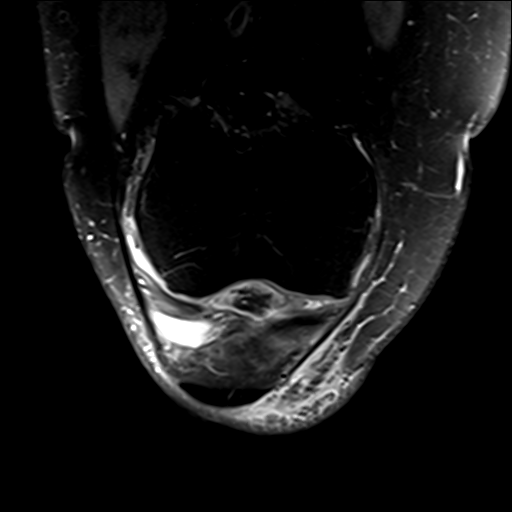

[12 of 40 positions shown; findings below may reference images not displayed]

FINDINGS: MENISCI

Medial meniscus: There is some degenerative signal in the posterior
horn but no tear is identified.

Lateral meniscus: Horizontal tear in the posterior horn reaches the
meniscal undersurface. Severe complex tearing is seen in the
posterior body and junction of the posterior horn and body. Large
radial tear along the free edge of the body is identified with a
superimposed horizontal tear reaching the femoral articular surface.
The body is extruded peripherally out of the joint.

LIGAMENTS

Cruciates: Marked mucoid degeneration of the ACL without tear is
identified. Mild medial degeneration of the PCL without tear is also
seen.

Collaterals:  Intact.

CARTILAGE

Patellofemoral: Thinning and irregularity are seen along the mid
patella in both the medial and lateral facets.

Medial:  Mildly degenerated.

Lateral: Markedly thinned throughout with associated joint space
narrowing.

Joint:  Moderate joint effusion.

Popliteal Fossa:  No Baker's cyst.

Extensor Mechanism:  Intact.

Bones:  No fracture or worrisome lesion.

Other: None.
IMPRESSION: Extensive tearing throughout the posterior horn and body of the
lateral meniscus. The body is extruded peripherally with severe
complex tearing extending from the junction of the posterior horn
and body into the midbody.

Osteoarthritis about the knee appearing worst in the lateral
compartment where there is near bone-on-bone joint space narrowing.
